# Patient Record
Sex: Male | Born: 1960 | Race: White | Hispanic: Yes | Marital: Married
Health system: Southern US, Community
[De-identification: ages and names within clinical notes are randomized; demographics above are authoritative.]

---

## 2005-09-13 ENCOUNTER — Ambulatory Visit: Payer: Self-pay | Admitting: Family Medicine

## 2005-09-22 ENCOUNTER — Ambulatory Visit: Payer: Self-pay | Admitting: *Deleted

## 2005-10-11 ENCOUNTER — Ambulatory Visit: Payer: Self-pay | Admitting: Family Medicine

## 2005-10-24 ENCOUNTER — Ambulatory Visit (HOSPITAL_COMMUNITY): Admission: RE | Admit: 2005-10-24 | Discharge: 2005-10-24 | Payer: Self-pay | Admitting: Family Medicine

## 2005-11-22 ENCOUNTER — Ambulatory Visit: Payer: Self-pay | Admitting: Family Medicine

## 2007-07-27 IMAGING — CR DG LUMBAR SPINE COMPLETE 4+V
5 series · 5 of 5 positions shown · non-contrast
Comparison: none

CLINICAL DATA: Low back pain.  
 LUMBAR SPINE ? 4 VIEW:

[t l-spine a.p.]
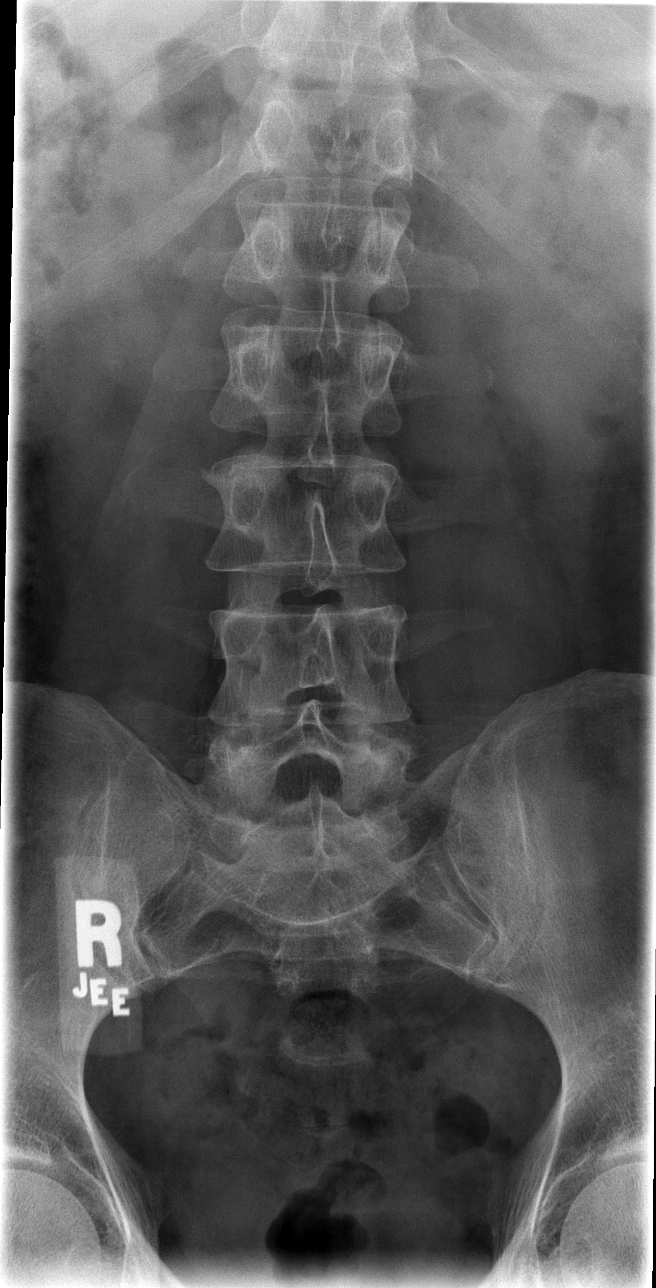

[t l-spine oblique exposure (1 of 2)]
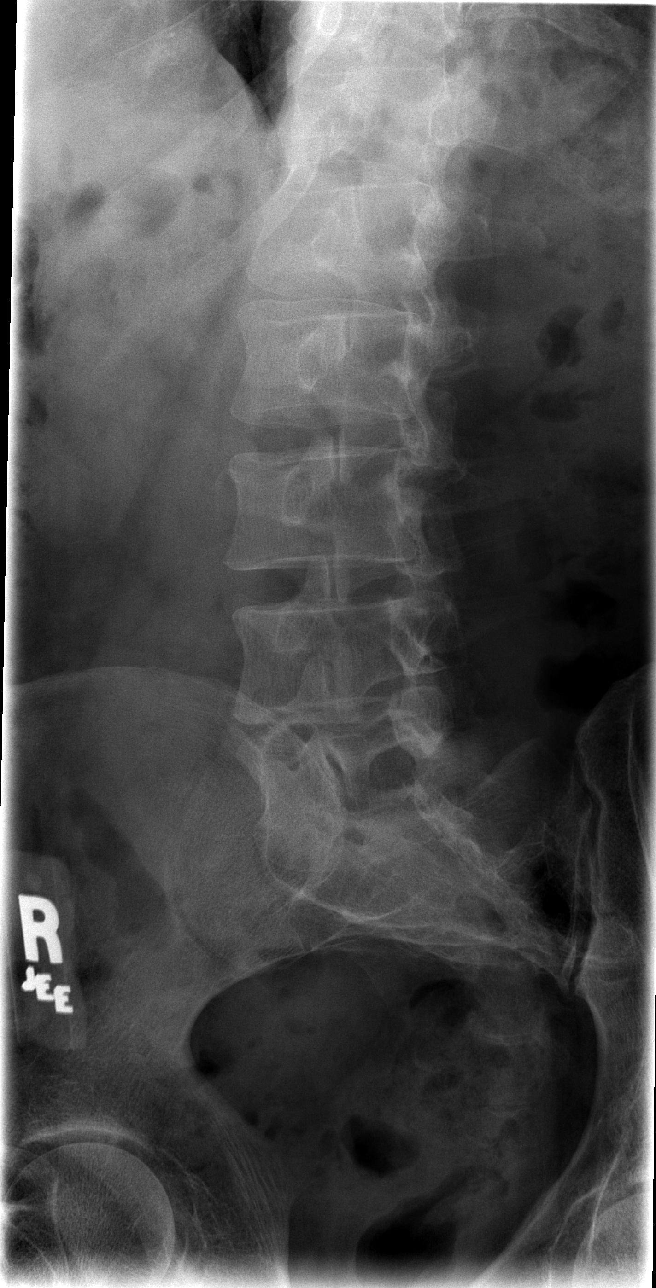

[t l-spine oblique exposure (2 of 2)]
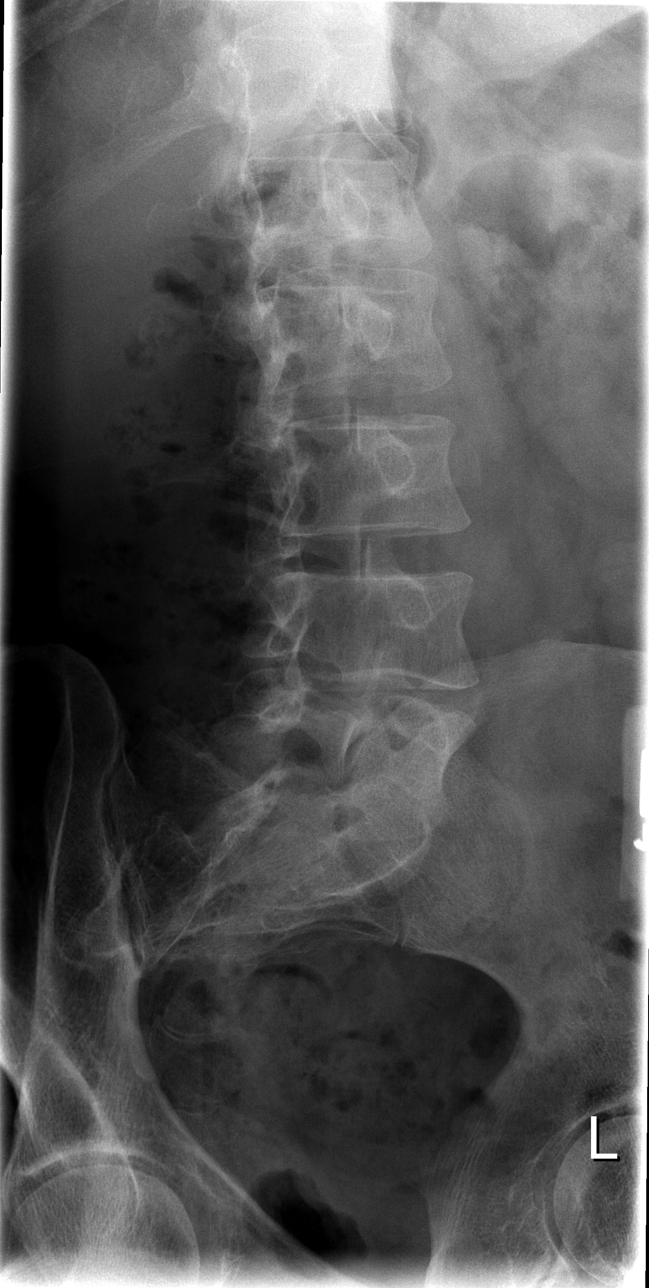

[t l-spine lat]
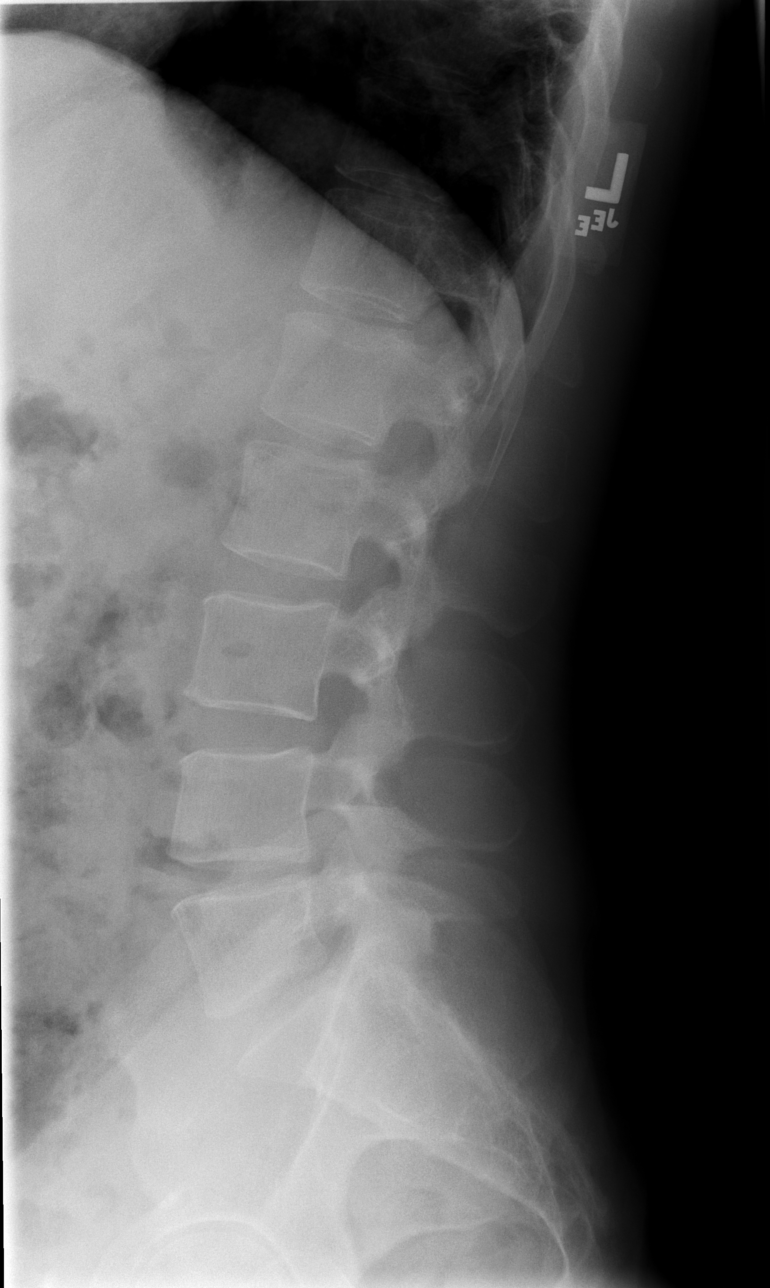

[t l-spine l5-s1 spot]
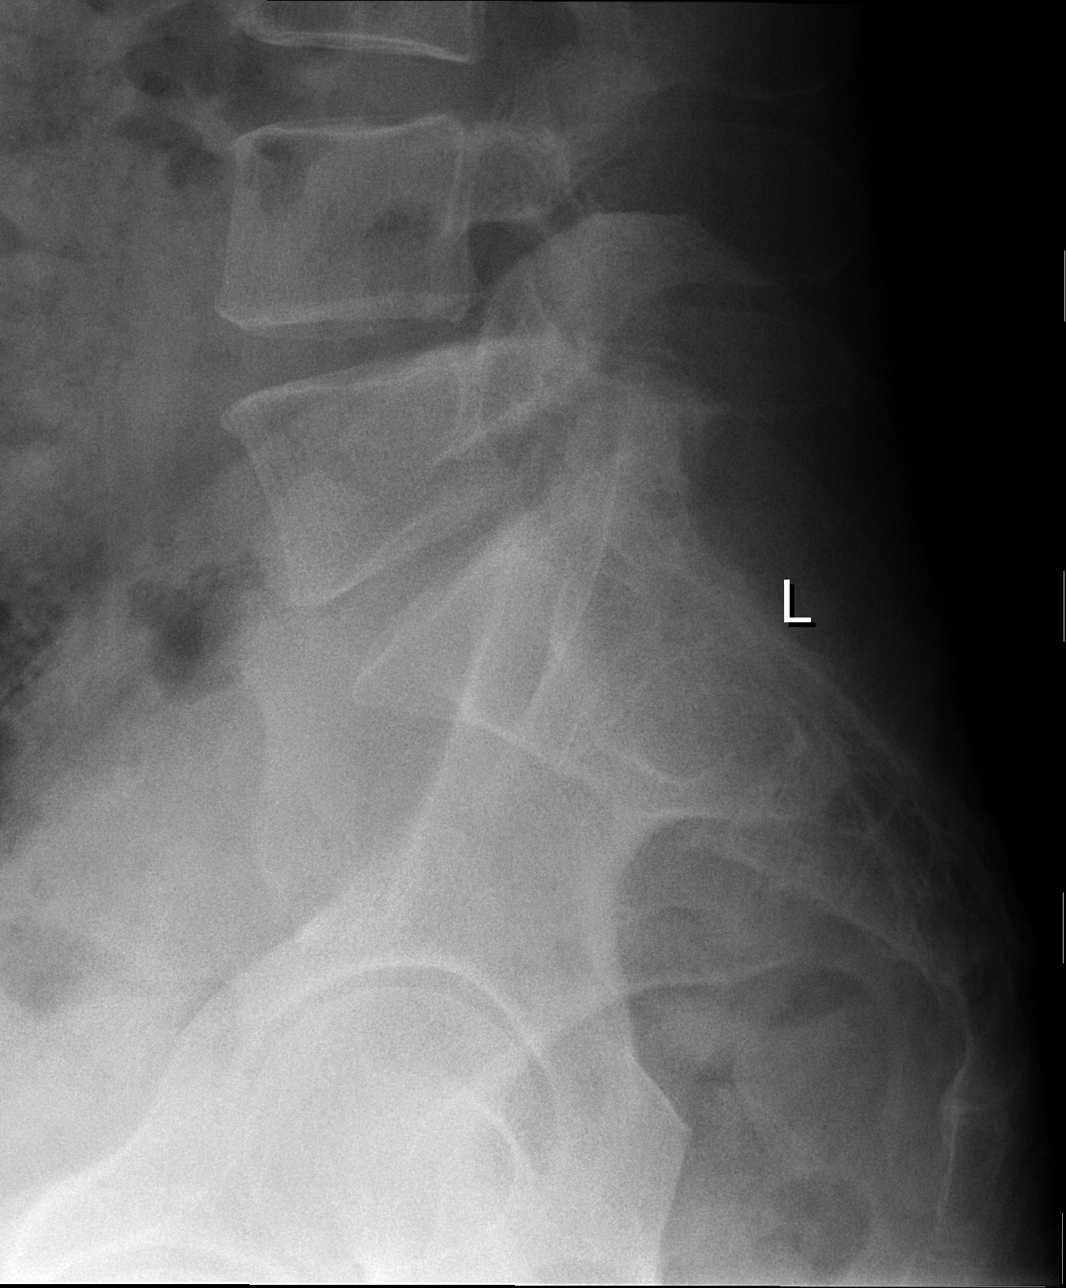

[5 of 5 positions shown; findings below may reference images not displayed]

FINDINGS: Four vies of the lumbar spine were obtained.  The lumbar vertebrae are in normal alignment with normal intervertebral disc spaces.  No compression deformity is seen.  The SI joints appear normal.
IMPRESSION: Negative lumbar spine.

## 2021-06-07 ENCOUNTER — Telehealth: Payer: Self-pay | Admitting: Hematology and Oncology

## 2021-06-07 NOTE — Telephone Encounter (Signed)
Scheduled appt per 8/24 referral. Pt aware of appt date and time.

## 2021-06-19 NOTE — Progress Notes (Signed)
  [facility] CONSULT NOTE  Patient Care Team: Pcp, No as PCP - General  CHIEF COMPLAINTS/PURPOSE OF CONSULTATION:  Newly diagnosed high serum ferritin  HISTORY OF PRESENTING ILLNESS:  Nicholas Whitaker 60 y.o. male is here because of recent diagnosis of high serum ferritin. Labs on 05/30/2021 showed ferritin 836. He presents to the clinic today for initial evaluation and discussion of treatment options.   I reviewed his records extensively and collaborated the history with the patient.  MEDICAL HISTORY:  BPH, alcohol abuse  SURGICAL HISTORY: No prior surgeries  SOCIAL HISTORY:  Denies any tobacco use.  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  FAMILY HISTORY: Mother had genitourinary cancer  ALLERGIES:  has no allergies on file.  MEDICATIONS:  Current Outpatient Medications  Medication Sig Dispense Refill   cholecalciferol (VITAMIN D3) 25 MCG (1000 UNIT) tablet Take 1 tablet (1,000 Units total) by mouth daily.     No current facility-administered medications for this visit.  He also takes a blood pressure pill and a prostate pill which she does not know the names of.  REVIEW OF SYSTEMS:   Constitutional: Denies fevers, chills or abnormal night sweats   All other systems were reviewed with the patient and are negative.  PHYSICAL EXAMINATION: ECOG PERFORMANCE STATUS: 1 - Symptomatic but completely ambulatory  Vitals:   06/21/21 1550  BP: 133/71  Pulse: 66  Resp: 18  Temp: (!) 97 F (36.1 C)  SpO2: 100%   Filed Weights   06/21/21 1550  Weight: 175 lb 7 oz (79.6 kg)      RADIOGRAPHIC STUDIES: I have personally reviewed the radiological reports and agreed with the findings in the report.  ASSESSMENT AND PLAN:  Elevated ferritin 05/20/21: TIBC: 263, Iron: 82, Iron Sat 31%, Ferritin: 836 Prior Alcohol abuse (upto 12 beers per day) he quit  alcohol about a month ago.  DD: Iron overload vs Acute phase reactant Most likely it is acute phase reactant because the iron sat is at 31% Advised staying of alcohol permanently.  I advised him not  to drink even 1 glass of alcohol in the future. Return to clinic on an as-needed basis.  All questions were answered. The patient knows to call the clinic with any problems, questions or concerns.   Sabas Sous, MD, MPH 06/21/2021    I, Alda Ponder, am acting as scribe for Serena Croissant, MD.  I have reviewed the above documentation for accuracy and completeness, and I agree with the above.

## 2021-06-21 ENCOUNTER — Inpatient Hospital Stay: Payer: Self-pay | Attending: Hematology and Oncology | Admitting: Hematology and Oncology

## 2021-06-21 ENCOUNTER — Other Ambulatory Visit: Payer: Self-pay

## 2021-06-21 DIAGNOSIS — F1010 Alcohol abuse, uncomplicated: Secondary | ICD-10-CM | POA: Insufficient documentation

## 2021-06-21 DIAGNOSIS — N400 Enlarged prostate without lower urinary tract symptoms: Secondary | ICD-10-CM | POA: Insufficient documentation

## 2021-06-21 DIAGNOSIS — R7989 Other specified abnormal findings of blood chemistry: Secondary | ICD-10-CM | POA: Insufficient documentation

## 2021-06-21 MED ORDER — VITAMIN D 25 MCG (1000 UNIT) PO TABS: 1000.0000 [IU] | ORAL_TABLET | Freq: Every day | ORAL | Status: AC

## 2021-06-21 NOTE — Assessment & Plan Note (Signed)
05/20/21: TIBC: 263, Iron: 82, Iron Sat 31%, Ferritin: 836 Prior Alcohol abuse (upto 6 beers per day)  DD: Iron overload vs Acute phase reactant Most likely it is acute phase reactant because the iron sat is at 31% Advised staying of alcohol

## 2023-02-19 LAB — EXTERNAL GENERIC LAB PROCEDURE: COLOGUARD: NEGATIVE

## 2024-03-06 ENCOUNTER — Ambulatory Visit: Payer: Self-pay | Admitting: Gastroenterology
